# Patient Record
Sex: Female | Born: 1935
Health system: Southern US, Community
[De-identification: ages and names within clinical notes are randomized; demographics above are authoritative.]

---

## 2015-09-01 ENCOUNTER — Observation Stay (HOSPITAL_COMMUNITY)
Admission: EM | Admit: 2015-09-01 | Discharge: 2015-09-03 | Disposition: A | Discharge status: Skilled Nursing Facility | Payer: Medicare Other | Attending: Internal Medicine | Admitting: Internal Medicine

## 2015-09-01 ENCOUNTER — Encounter (HOSPITAL_COMMUNITY): Payer: Self-pay | Admitting: *Deleted

## 2015-09-01 ENCOUNTER — Emergency Department (HOSPITAL_COMMUNITY): Payer: Medicare Other

## 2015-09-01 DIAGNOSIS — I129 Hypertensive chronic kidney disease with stage 1 through stage 4 chronic kidney disease, or unspecified chronic kidney disease: Secondary | ICD-10-CM | POA: Diagnosis not present

## 2015-09-01 DIAGNOSIS — I10 Essential (primary) hypertension: Secondary | ICD-10-CM | POA: Diagnosis present

## 2015-09-01 DIAGNOSIS — Z23 Encounter for immunization: Secondary | ICD-10-CM | POA: Insufficient documentation

## 2015-09-01 DIAGNOSIS — G309 Alzheimer's disease, unspecified: Secondary | ICD-10-CM | POA: Diagnosis not present

## 2015-09-01 DIAGNOSIS — J811 Chronic pulmonary edema: Secondary | ICD-10-CM

## 2015-09-01 DIAGNOSIS — N183 Chronic kidney disease, stage 3 (moderate): Secondary | ICD-10-CM | POA: Diagnosis not present

## 2015-09-01 DIAGNOSIS — Z79899 Other long term (current) drug therapy: Secondary | ICD-10-CM | POA: Insufficient documentation

## 2015-09-01 DIAGNOSIS — Z6838 Body mass index (BMI) 38.0-38.9, adult: Secondary | ICD-10-CM | POA: Insufficient documentation

## 2015-09-01 DIAGNOSIS — H409 Unspecified glaucoma: Secondary | ICD-10-CM | POA: Insufficient documentation

## 2015-09-01 DIAGNOSIS — F0390 Unspecified dementia without behavioral disturbance: Secondary | ICD-10-CM | POA: Diagnosis present

## 2015-09-01 DIAGNOSIS — F0280 Dementia in other diseases classified elsewhere without behavioral disturbance: Secondary | ICD-10-CM | POA: Diagnosis not present

## 2015-09-01 DIAGNOSIS — G9340 Encephalopathy, unspecified: Secondary | ICD-10-CM | POA: Diagnosis not present

## 2015-09-01 DIAGNOSIS — R4182 Altered mental status, unspecified: Secondary | ICD-10-CM

## 2015-09-01 DIAGNOSIS — K219 Gastro-esophageal reflux disease without esophagitis: Secondary | ICD-10-CM | POA: Diagnosis not present

## 2015-09-01 DIAGNOSIS — Z86711 Personal history of pulmonary embolism: Secondary | ICD-10-CM | POA: Diagnosis not present

## 2015-09-01 DIAGNOSIS — E669 Obesity, unspecified: Secondary | ICD-10-CM | POA: Diagnosis not present

## 2015-09-01 DIAGNOSIS — R400 Somnolence: Secondary | ICD-10-CM | POA: Diagnosis present

## 2015-09-01 LAB — CBG MONITORING, ED: Glucose-Capillary: 99 mg/dL (ref 65–99)

## 2015-09-01 MED ORDER — SODIUM CHLORIDE 0.9 % IV BOLUS (SEPSIS)
500.0000 mL | Freq: Once | INTRAVENOUS | Status: AC
  Administered 2015-09-02: 500 mL via INTRAVENOUS

## 2015-09-01 NOTE — ED Notes (Signed)
CBG: 99 RN notified 

## 2015-09-01 NOTE — ED Provider Notes (Addendum)
By signing my name below, I, Ronney LionSuzanne Le, attest that this documentation has been prepared under the direction and in the presence of Kristen N Ward, DO. Electronically Signed: Ronney LionSuzanne Le, ED Scribe. 09/01/2015. 2:35 PM.  TIME SEEN: 11:14 PM   CHIEF COMPLAINT: Fatigue  LEVEL 5 CAVEAT DUE TO PATIENT NONVERBAL  HPI:  HPI Comments: Lori Hansen is a 82 y.o. female with a history of PE ~6 years ago (not currently on anticoagulation; previously anticoagulated on Warfarin), dementia, hypertension, and hyperlipidemia brought in by ambulance [facility], who presents to the Emergency Department, for constant, moderate, somnolence that began today. Her family states EMS [facility] for somnolence, although her family is unaware of any symptoms recently. Patient's daughter states she last saw patient well about 10 hours ago, at 2 PM, after she had taken patient out shopping. She is unaware of any fever but states patient had refused to take off her jacket all day today, despite being in 80-degree weather. Her daughter also denies falls or head injuries. Per medical records, patient is currently prescribed Vistaril as needed for itching. No other sedatives listed. Her family denies any home oxygen therapy. She does note patient was on Warfarin for a long period of time, although she is not currently on any anticoagulation. Her daughter denies any recent known cough, vomiting, or diarrhea.  PCP on MAR - Dr. Ron ParkerSamuel Bowen  ROS: LEVEL 5 CAVEAT DUE TO: PATIENT NONVERBAL  PAST MEDICAL HISTORY/PAST SURGICAL HISTORY:  History reviewed. No pertinent past medical history.  MEDICATIONS:  Prior to Admission medications   Medication Sig Start Date End Date Taking? Authorizing Provider  acetaminophen (TYLENOL) 500 MG tablet Take 500 mg by mouth every 4 (four) hours as needed for moderate pain.   Yes Historical Provider, MD  alum & mag hydroxide-simeth  (GELUSIL) 200-200-25 MG chewable tablet Chew 1 tablet by mouth every 6 (six) hours as needed for indigestion or heartburn.   Yes Historical Provider, MD  donepezil (ARICEPT) 10 MG tablet Take 10 mg by mouth at bedtime.   Yes Historical Provider, MD  ergocalciferol (VITAMIN D2) 50000 units capsule Take 50,000 Units by mouth once a week.   Yes Historical Provider, MD  esomeprazole (NEXIUM) 40 MG capsule Take 40 mg by mouth daily at 12 noon.   Yes Historical Provider, MD  guaifenesin (ROBAFEN) 100 MG/5ML syrup Take 200 mg by mouth 3 (three) times daily as needed for cough.   Yes Historical Provider, MD  hydrochlorothiazide (MICROZIDE) 12.5 MG capsule Take 12.5 mg by mouth daily.   Yes Historical Provider, MD  hydrOXYzine (ATARAX/VISTARIL) 25 MG tablet Take 25 mg by mouth 3 (three) times daily as needed.   Yes Historical Provider, MD  loperamide (IMODIUM) 2 MG capsule Take 2 mg by mouth as needed for diarrhea or loose stools.   Yes Historical Provider, MD  loratadine (CLARITIN) 10 MG tablet Take 10 mg by mouth daily.   Yes Historical Provider, MD  magnesium hydroxide (MILK OF MAGNESIA) 400 MG/5ML suspension Take 30 mLs by mouth daily as needed for mild constipation.   Yes Historical Provider, MD  metoprolol succinate (TOPROL-XL) 25 MG 24 hr tablet Take 25 mg by mouth daily.   Yes Historical Provider, MD  neomycin-bacitracin-polymyxin (NEOSPORIN) OINT Apply 1 application topically daily as needed for wound care.   Yes Historical Provider, MD  nystatin (NYSTATIN) powder Apply 1 g topically daily.   Yes Historical Provider, MD  Skin Protectants,  Misc. (EUCERIN) cream Apply 1 application topically as needed for dry skin.   Yes Historical Provider, MD  Travoprost, BAK Free, (TRAVATAN) 0.004 % SOLN ophthalmic solution Place 1 drop into both eyes at bedtime.   Yes Historical Provider, MD  triamcinolone cream (KENALOG) 0.1 % Apply 1 application topically 2 (two) times daily as needed (irritation).   Yes  Historical Provider, MD    ALLERGIES:  No Known Allergies  SOCIAL HISTORY:  Social History  Substance Use Topics  . Smoking status: Unknown If Ever Smoked  . Smokeless tobacco: Not on file  . Alcohol Use: No    FAMILY HISTORY: History reviewed. No pertinent family history.  EXAM: BP 113/65 mmHg  Pulse 68  Temp(Src) 97.5 F (36.4 C) (Rectal)  Resp 18  SpO2 94% CONSTITUTIONAL: She is drowsy, but will arouse spontaneously and intermittently to voice and painful stimuli. Able to mumble some words, MAE equally, and open eyes. GCS 13. Elderly, obese, and in NAD. Afebrile. HEAD: Normocephalic, atraumatic EYES: Conjunctivae clear, pupils 3-56mm bilaterally and sluggishly reactive ENT: normal nose; no rhinorrhea; moist mucous membranes NECK: Supple, no meningismus, no LAD, no midline step-off or deformity CARD: RRR; S1 and S2 appreciated; no murmurs, no clicks, no rubs, no gallops RESP: Normal chest excursion without splinting or tachypnea; breath sounds clear and equal bilaterally; no wheezes, no rhonchi, no rales, no hypoxia or respiratory distress, speaking full sentences ABD/GI: Normal bowel sounds; non-distended; soft, non-tender, no rebound, no guarding, no peritoneal signs BACK:  The back appears normal and is non-tender to palpation, there is no CVA tenderness; no midline step-off or deformity EXT: Normal ROM in all joints; non-tender to palpation; no edema; normal capillary refill; no cyanosis, no calf tenderness or swelling    SKIN: Normal color for age and race; warm; no rash NEURO: Moves all extremities equally, GCS 13.  PSYCH: Unable to assess.   MEDICAL DECISION MAKING: Patient here with altered status. She will arouse at times spontaneously and voice but mostly to painful stimuli. She is protecting her airway. We'll put her eyes, move all her extremities. She can mumble some words to rest. She was able to tell nursing staff that she is "just tired". Call nursing facility  (spoke to [place] at [facility] [phone]) who states 8 PM she received her Aricept and eyedrops. The only medication that she has listed that as a sedative is Vistaril which she has not received it today or in the past month. No known head injury. No reports of any recent infectious symptoms. Last seen normal 2 PM. Differential diagnosis includes stroke, intracranial hemorrhage, infection, dehydration. Will obtain labs, urine, chest x-ray, head CT. Blood glucose is normal. EKG shows no ischemic abnormalities.   ED PROGRESS:   2:31 AM - Patient's workup has been unremarkable other than mild leukocytosis with predominant lymphocytes. She does have mildly elevated creatinine without old for comparison but a normal BUNs I suspect that this is her baseline. Urine shows moderate hemoglobin but no other sign of infection. Drug screen is negative. Head CT shows no acute abnormalities. Chest x-ray shows no infiltrate. Patient is now more arousable and arouses to voice more easily than she did previously but still very drowsy and falls back asleep quickly. She is able to tell me her first name and that she thinks that she is at [facility]. She thinks the year is 12.  Per family, this is very different than her normal. They state that she is normally very interactive, talkative,  ambulatory. We'll discuss with hospitalist for observation admission. She may need an MRI of her brain to rule out acute stroke although no obvious focal neurologic deficit on exam. She would not be a TPA candidate given last seen normal 2 PM yesterday.  3:05 AM  Discussed patient's case with hospitalist, Dr. Lovell Sheehan.  Recommend admission to telemetry, observation bed.  I will place holding orders per their request. Patient and family (if present) updated with plan. Care transferred to hospitalist service.  Discussed with Dr. Lovell Sheehan who agrees that we can hold off on MRI of her brain at this time given no focal neurologic  deficits and she would be outside of TPA window given last seen normal that can be documented was 2 PM. Nursing staff unable to tell me when she was last normal. Recommend obtaining ABG to insure no sign of hypercarbia.    I reviewed all nursing notes, vitals, pertinent old records, EKGs, labs, imaging (as available).    EKG Interpretation  Date/Time:  Saturday Sep 01 2015 23:59:21 EDT Ventricular Rate:  67 PR Interval:  159 QRS Duration: 93 QT Interval:  468 QTC Calculation: 494 R Axis:   7 Text Interpretation:  Sinus rhythm Abnormal R-wave progression, early transition Left ventricular hypertrophy Borderline prolonged QT interval No old tracing to compare Confirmed by WARD,  DO, KRISTEN ([id number]) on 09/02/2015 12:06:30 AM        I personally performed the services described in this documentation, which was scribed in my presence. The recorded information has been reviewed and is accurate.   Kristen N Ward, DO 09/02/15 0307   4:00 AM  Patient's ABG shows no respiratory acidosis, hypercarbia. Patient is still stable. No significant change.  Layla Maw Ward, DO 09/02/15 [phone]

## 2015-09-01 NOTE — ED Notes (Signed)
[facility] arrives [facility]. Pt was out all day with her daughter shopping, was up 1.5 hours past her bedtime and had her night time meds. Pt states she just wants to be left alone and shes tired.

## 2015-09-02 ENCOUNTER — Emergency Department (HOSPITAL_COMMUNITY): Payer: Medicare Other

## 2015-09-02 ENCOUNTER — Encounter (HOSPITAL_COMMUNITY): Payer: Self-pay | Admitting: Radiology

## 2015-09-02 ENCOUNTER — Observation Stay (HOSPITAL_COMMUNITY): Payer: Medicare Other

## 2015-09-02 DIAGNOSIS — I10 Essential (primary) hypertension: Secondary | ICD-10-CM | POA: Diagnosis not present

## 2015-09-02 DIAGNOSIS — R4182 Altered mental status, unspecified: Secondary | ICD-10-CM | POA: Insufficient documentation

## 2015-09-02 DIAGNOSIS — G9340 Encephalopathy, unspecified: Secondary | ICD-10-CM | POA: Diagnosis present

## 2015-09-02 DIAGNOSIS — F0390 Unspecified dementia without behavioral disturbance: Secondary | ICD-10-CM | POA: Diagnosis present

## 2015-09-02 LAB — I-STAT ARTERIAL BLOOD GAS, ED
Acid-base deficit: 1 mmol/L (ref 0.0–2.0)
BICARBONATE: 24.9 meq/L — AB (ref 20.0–24.0)
O2 Saturation: 92 %
PCO2 ART: 44.1 mmHg (ref 35.0–45.0)
PH ART: 7.361 (ref 7.350–7.450)
PO2 ART: 68 mmHg — AB (ref 80.0–100.0)
Patient temperature: 98.6
TCO2: 26 mmol/L (ref 0–100)

## 2015-09-02 LAB — URINALYSIS, ROUTINE W REFLEX MICROSCOPIC
Bilirubin Urine: NEGATIVE
GLUCOSE, UA: NEGATIVE mg/dL
KETONES UR: NEGATIVE mg/dL
LEUKOCYTES UA: NEGATIVE
Nitrite: NEGATIVE
PROTEIN: NEGATIVE mg/dL
Specific Gravity, Urine: 1.02 (ref 1.005–1.030)
pH: 5.5 (ref 5.0–8.0)

## 2015-09-02 LAB — CBC
HEMATOCRIT: 40.3 % (ref 36.0–46.0)
HEMOGLOBIN: 13 g/dL (ref 12.0–15.0)
MCH: 29 pg (ref 26.0–34.0)
MCHC: 32.3 g/dL (ref 30.0–36.0)
MCV: 89.8 fL (ref 78.0–100.0)
Platelets: 239 10*3/uL (ref 150–400)
RBC: 4.49 MIL/uL (ref 3.87–5.11)
RDW: 15.5 % (ref 11.5–15.5)
WBC: 13 10*3/uL — ABNORMAL HIGH (ref 4.0–10.5)

## 2015-09-02 LAB — CBC WITH DIFFERENTIAL/PLATELET
BASOS PCT: 1 %
Basophils Absolute: 0.1 10*3/uL (ref 0.0–0.1)
EOS ABS: 0.2 10*3/uL (ref 0.0–0.7)
EOS PCT: 2 %
HCT: 38.7 % (ref 36.0–46.0)
Hemoglobin: 12.4 g/dL (ref 12.0–15.0)
LYMPHS ABS: 6.2 10*3/uL — AB (ref 0.7–4.0)
Lymphocytes Relative: 56 %
MCH: 29 pg (ref 26.0–34.0)
MCHC: 32 g/dL (ref 30.0–36.0)
MCV: 90.4 fL (ref 78.0–100.0)
MONO ABS: 1 10*3/uL (ref 0.1–1.0)
Monocytes Relative: 9 %
NEUTROS ABS: 3.5 10*3/uL (ref 1.7–7.7)
NEUTROS PCT: 32 %
PLATELETS: 241 10*3/uL (ref 150–400)
RBC: 4.28 MIL/uL (ref 3.87–5.11)
RDW: 15.3 % (ref 11.5–15.5)
WBC: 11 10*3/uL — ABNORMAL HIGH (ref 4.0–10.5)

## 2015-09-02 LAB — COMPREHENSIVE METABOLIC PANEL
ALT: 23 U/L (ref 14–54)
ANION GAP: 11 (ref 5–15)
AST: 40 U/L (ref 15–41)
Albumin: 3.2 g/dL — ABNORMAL LOW (ref 3.5–5.0)
Alkaline Phosphatase: 85 U/L (ref 38–126)
BUN: 13 mg/dL (ref 6–20)
CHLORIDE: 106 mmol/L (ref 101–111)
CO2: 25 mmol/L (ref 22–32)
CREATININE: 1.36 mg/dL — AB (ref 0.44–1.00)
Calcium: 9 mg/dL (ref 8.9–10.3)
GFR, EST AFRICAN AMERICAN: 40 mL/min — AB (ref >60)
GFR, EST NON AFRICAN AMERICAN: 35 mL/min — AB (ref >60)
Glucose, Bld: 101 mg/dL — ABNORMAL HIGH (ref 65–99)
Potassium: 3.5 mmol/L (ref 3.5–5.1)
SODIUM: 142 mmol/L (ref 135–145)
Total Bilirubin: 0.9 mg/dL (ref 0.3–1.2)
Total Protein: 6.3 g/dL — ABNORMAL LOW (ref 6.5–8.1)

## 2015-09-02 LAB — BASIC METABOLIC PANEL
ANION GAP: 15 (ref 5–15)
BUN: 12 mg/dL (ref 6–20)
CHLORIDE: 106 mmol/L (ref 101–111)
CO2: 21 mmol/L — AB (ref 22–32)
Calcium: 9.6 mg/dL (ref 8.9–10.3)
Creatinine, Ser: 1.21 mg/dL — ABNORMAL HIGH (ref 0.44–1.00)
GFR calc non Af Amer: 40 mL/min — ABNORMAL LOW (ref >60)
GFR, EST AFRICAN AMERICAN: 47 mL/min — AB (ref >60)
Glucose, Bld: 77 mg/dL (ref 65–99)
POTASSIUM: 3.9 mmol/L (ref 3.5–5.1)
Sodium: 142 mmol/L (ref 135–145)

## 2015-09-02 LAB — GLUCOSE, CAPILLARY
GLUCOSE-CAPILLARY: 82 mg/dL (ref 65–99)
GLUCOSE-CAPILLARY: 144 mg/dL — AB (ref 65–99)
GLUCOSE-CAPILLARY: 74 mg/dL (ref 65–99)
Glucose-Capillary: 90 mg/dL (ref 65–99)
Glucose-Capillary: 68 mg/dL (ref 65–99)

## 2015-09-02 LAB — URINE MICROSCOPIC-ADD ON

## 2015-09-02 LAB — I-STAT TROPONIN, ED: TROPONIN I, POC: 0 ng/mL (ref 0.00–0.08)

## 2015-09-02 LAB — RAPID URINE DRUG SCREEN, HOSP PERFORMED
Amphetamines: NOT DETECTED
BENZODIAZEPINES: NOT DETECTED
Barbiturates: NOT DETECTED
Cocaine: NOT DETECTED
OPIATES: NOT DETECTED
Tetrahydrocannabinol: NOT DETECTED

## 2015-09-02 LAB — MRSA PCR SCREENING: MRSA BY PCR: NEGATIVE

## 2015-09-02 LAB — AMMONIA: AMMONIA: 30 umol/L (ref 9–35)

## 2015-09-02 MED ORDER — DEXTROSE 50 % IV SOLN
INTRAVENOUS | Status: AC
  Filled 2015-09-02: qty 50

## 2015-09-02 MED ORDER — HYDROCHLOROTHIAZIDE 12.5 MG PO CAPS: 12.5000 mg | ORAL_CAPSULE | Freq: Every day | ORAL | Status: DC

## 2015-09-02 MED ORDER — METOPROLOL SUCCINATE ER 25 MG PO TB24
25.0000 mg | ORAL_TABLET | Freq: Every day | ORAL | Status: DC
  Administered 2015-09-02 – 2015-09-03 (×2): 25 mg via ORAL
  Filled 2015-09-02 (×2): qty 1

## 2015-09-02 MED ORDER — PNEUMOCOCCAL VAC POLYVALENT 25 MCG/0.5ML IJ INJ
0.5000 mL | INJECTION | INTRAMUSCULAR | Status: AC
  Administered 2015-09-03: 0.5 mL via INTRAMUSCULAR
  Filled 2015-09-02: qty 0.5

## 2015-09-02 MED ORDER — LOPERAMIDE HCL 2 MG PO CAPS: 2.0000 mg | ORAL_CAPSULE | ORAL | Status: DC | PRN

## 2015-09-02 MED ORDER — ONDANSETRON HCL 4 MG PO TABS: 4.0000 mg | ORAL_TABLET | Freq: Four times a day (QID) | ORAL | Status: DC | PRN

## 2015-09-02 MED ORDER — LATANOPROST 0.005 % OP SOLN
1.0000 [drp] | Freq: Every day | OPHTHALMIC | Status: DC
  Administered 2015-09-02: 1 [drp] via OPHTHALMIC
  Filled 2015-09-02: qty 2.5

## 2015-09-02 MED ORDER — HYDROMORPHONE HCL 1 MG/ML IJ SOLN: 0.5000 mg | INTRAMUSCULAR | Status: DC | PRN

## 2015-09-02 MED ORDER — DONEPEZIL HCL 10 MG PO TABS
10.0000 mg | ORAL_TABLET | Freq: Every day | ORAL | Status: DC
Start: 2015-09-02 — End: 2015-09-03
  Administered 2015-09-02: 10 mg via ORAL
  Filled 2015-09-02: qty 1

## 2015-09-02 MED ORDER — ONDANSETRON HCL 4 MG/2ML IJ SOLN: 4.0000 mg | Freq: Four times a day (QID) | INTRAMUSCULAR | Status: DC | PRN

## 2015-09-02 MED ORDER — PANTOPRAZOLE SODIUM 40 MG PO TBEC
40.0000 mg | DELAYED_RELEASE_TABLET | Freq: Every day | ORAL | Status: DC
  Administered 2015-09-02 – 2015-09-03 (×2): 40 mg via ORAL
  Filled 2015-09-02 (×2): qty 1

## 2015-09-02 MED ORDER — SODIUM CHLORIDE 0.9 % IV SOLN
INTRAVENOUS | Status: DC
  Administered 2015-09-02: 05:00:00 via INTRAVENOUS

## 2015-09-02 MED ORDER — ACETAMINOPHEN 325 MG PO TABS: 650.0000 mg | ORAL_TABLET | Freq: Four times a day (QID) | ORAL | Status: DC | PRN

## 2015-09-02 MED ORDER — ACETAMINOPHEN 650 MG RE SUPP: 650.0000 mg | Freq: Four times a day (QID) | RECTAL | Status: DC | PRN

## 2015-09-02 MED ORDER — ENOXAPARIN SODIUM 40 MG/0.4ML ~~LOC~~ SOLN
40.0000 mg | SUBCUTANEOUS | Status: DC
  Administered 2015-09-02: 40 mg via SUBCUTANEOUS
  Filled 2015-09-02: qty 0.4

## 2015-09-02 MED ORDER — SODIUM CHLORIDE 0.9% FLUSH
3.0000 mL | Freq: Two times a day (BID) | INTRAVENOUS | Status: DC
  Administered 2015-09-02 – 2015-09-03 (×2): 3 mL via INTRAVENOUS

## 2015-09-02 NOTE — Progress Notes (Signed)
PROGRESS NOTE    Lori OvermanMuriel Grizzell  UJW:[id number]:[id number] DOB: 11/22/1929 DOA: 09/01/2015 PCP: No primary care provider on file.   Brief Narrative:  On 09/02/2015 by Dr. Della GooHarvette Jenkins Lori OvermanMuriel Coletta is a 82 y.o. female resident [facility] who was sent to the ED due to increased somnolence and lethargy over the day. She had been last seen normal by her family at 2 PM. She is arousible and denies any pain. She was evaluated in the ED and a Ct scan of the head was performed and was negative for acute findings. Her labs were not remarkable, and a UDS was performed and was negative, and ABG has bee ordered. She was referred for Observation.  Assessment & Plan   Acute encephalopathy -Appears to have resolved -Unclear etiology, question medications -CT head: No acute intracranial abnormalities -Patient appears be back at baseline, no neurological deficits -Chest x-ray shows pulmonary vascular congestion in this low inspiratory portable examination -Will obtain repeat chest x-ray although on exam lungs are clear -UA unremarkable for infection -Toxicology screen negative  Essential hypertension -Will restart home medications, metoprolol  Dementia  -Will restart Aricept  Acute kidney injury vs Chronic kidney disease, stage III -HCTZ held -Was placed on IV fluids -Continue to monitor BMP  DVT Prophylaxis  Lovenox  Code Status: Full  Family Communication: Son at bedside  Disposition Plan: Admitted for observation  Consultants None  Procedures  None  Antibiotics   Anti-infectives    None      Subjective:   Lori OvermanMuriel Rundell seen and examined today.  Patient has no complaints. Denies  Pain, chest pain, shortness of breath, abdominal pain, nausea or vomiting.  Objective:   Filed Vitals:   09/02/15 0345 09/02/15 0427 09/02/15 0427 09/02/15 1253  BP: 136/80  138/64 156/72  Pulse: 55  58 62  Temp:   97.6 F (36.4 C) 97.7 F (36.5 C)  TempSrc:   Oral  Oral  Resp: 13  20 15   Height:  5\' 6"  (1.676 m)    Weight:  109.1 kg (240 lb 8.4 oz)    SpO2: 94%  100% 98%    Intake/Output Summary (Last 24 hours) at 09/02/15 1411 Last data filed at 09/02/15 1350  Gross per 24 hour  Intake   12.5 ml  Output    700 ml  Net -687.5 ml   Filed Weights   09/02/15 0427  Weight: 109.1 kg (240 lb 8.4 oz)    Exam  General: Well developed, well nourished, NAD, appears stated age  HEENT: NCAT,  mucous membranes moist.   Cardiovascular: S1 S2 auscultated, no rubs, murmurs or gallops. Regular rate and rhythm.  Respiratory: Clear to auscultation bilaterally  Abdomen: Soft, nontender, nondistended, + bowel sounds  Extremities: warm dry without cyanosis clubbing or edema  Neuro: AAOx2, has dementia, nonfocal  Skin: Without rashes exudates or nodules  Psych: Normal affect and demeanor   Data Reviewed: I have personally reviewed following labs and imaging studies  CBC:  Recent Labs Lab 09/02/15 0025 09/02/15 0618  WBC 11.0* 13.0*  NEUTROABS 3.5  --   HGB 12.4 13.0  HCT 38.7 40.3  MCV 90.4 89.8  PLT 241 239   Basic Metabolic Panel:  Recent Labs Lab 09/02/15 0025 09/02/15 0618  NA 142 142  K 3.5 3.9  CL 106 106  CO2 25 21*  GLUCOSE 101* 77  BUN 13 12  CREATININE 1.36* 1.21*  CALCIUM 9.0 9.6   GFR: Estimated Creatinine Clearance: 44  mL/min (by C-G formula based on Cr of 1.21). Liver Function Tests:  Recent Labs Lab 09/02/15 0025  AST 40  ALT 23  ALKPHOS 85  BILITOT 0.9  PROT 6.3*  ALBUMIN 3.2*   No results for input(s): LIPASE, AMYLASE in the last 168 hours.  Recent Labs Lab 09/02/15 0023  AMMONIA 30   Coagulation Profile: No results for input(s): INR, PROTIME in the last 168 hours. Cardiac Enzymes: No results for input(s): CKTOTAL, CKMB, CKMBINDEX, TROPONINI in the last 168 hours. BNP (last 3 results) No results for input(s): PROBNP in the last 8760 hours. HbA1C: No results for input(s): HGBA1C in the  last 72 hours. CBG:  Recent Labs Lab 09/01/15 2332 09/02/15 0449 09/02/15 0741 09/02/15 0824 09/02/15 1255  GLUCAP 99 90 68 82 144*   Lipid Profile: No results for input(s): CHOL, HDL, LDLCALC, TRIG, CHOLHDL, LDLDIRECT in the last 72 hours. Thyroid Function Tests: No results for input(s): TSH, T4TOTAL, FREET4, T3FREE, THYROIDAB in the last 72 hours. Anemia Panel: No results for input(s): VITAMINB12, FOLATE, FERRITIN, TIBC, IRON, RETICCTPCT in the last 72 hours. Urine analysis:    Component Value Date/Time   COLORURINE YELLOW 09/02/2015 0040   APPEARANCEUR CLEAR 09/02/2015 0040   LABSPEC 1.020 09/02/2015 0040   PHURINE 5.5 09/02/2015 0040   GLUCOSEU NEGATIVE 09/02/2015 0040   HGBUR MODERATE* 09/02/2015 0040   BILIRUBINUR NEGATIVE 09/02/2015 0040   KETONESUR NEGATIVE 09/02/2015 0040   PROTEINUR NEGATIVE 09/02/2015 0040   NITRITE NEGATIVE 09/02/2015 0040   LEUKOCYTESUR NEGATIVE 09/02/2015 0040   Sepsis Labs: @LABRCNTIP (procalcitonin:4,lacticidven:4)  ) Recent Results (from the past 240 hour(s))  MRSA PCR Screening     Status: None   Collection Time: 09/02/15  5:02 AM  Result Value Ref Range Status   MRSA by PCR NEGATIVE NEGATIVE Final    Comment:        The GeneXpert MRSA Assay (FDA approved for NASAL specimens only), is one component of a comprehensive MRSA colonization surveillance program. It is not intended to diagnose MRSA infection nor to guide or monitor treatment for MRSA infections.       Radiology Studies: Ct Head Wo Contrast  09/02/2015  CLINICAL DATA:  Altered mental status. EXAM: CT HEAD WITHOUT CONTRAST TECHNIQUE: Contiguous axial images were obtained from the base of the skull through the vertex without intravenous contrast. COMPARISON:  None. FINDINGS: Diffuse cerebral atrophy. Mild ventricular dilatation consistent with central atrophy. Low-attenuation changes in the deep white matter consistent with small vessel ischemia. No mass effect or  midline shift. No abnormal extra-axial fluid collections. Gray-white matter junctions are distinct. Basal cisterns are not effaced. No evidence of acute intracranial hemorrhage. No depressed skull fractures. Mild mucosal thickening in the paranasal sinuses. Mastoid air cells are not opacified. Vascular calcifications. IMPRESSION: No acute intracranial abnormalities. Chronic atrophy and small vessel ischemic changes. Electronically Signed   By: Burman Nieves M.D.   On: 09/02/2015 02:03   Dg Chest Portable 1 View  09/02/2015  CLINICAL DATA:  Altered mental status. EXAM: PORTABLE CHEST 1 VIEW COMPARISON:  None. FINDINGS: Cardiomediastinal silhouette is unremarkable for this low inspiratory portable examination with crowded engorged vasculature markings. The lungs are clear without pleural effusions or focal consolidations. Elevated RIGHT hemidiaphragm. Trachea projects midline and there is no pneumothorax. Included soft tissue planes and osseous structures are non-suspicious. IMPRESSION: Pulmonary vascular congestion in this low inspiratory portable examination. Electronically Signed   By: Awilda Metro M.D.   On: 09/02/2015 00:42     Scheduled Meds: .  enoxaparin (LOVENOX) injection  40 mg Subcutaneous Q24H  . latanoprost  1 drop Both Eyes QHS  . [START ON 09/03/2015] pneumococcal 23 valent vaccine  0.5 mL Intramuscular Tomorrow-1000  . sodium chloride flush  3 mL Intravenous Q12H   Continuous Infusions:      Time Spent in minutes   30 minutes  Lucia Harm D.O. on 09/02/2015 at 2:11 PM  Between 7am to 7pm - Pager - [phone]  After 7pm go to [email or web address] - password [email or web address]  And look for the night coverage person covering for me after hours  [facility] Office  [phone]

## 2015-09-02 NOTE — Progress Notes (Signed)
Hypoglycemic Event  CBG: 68  Treatment: 15 gm carbohydrate snack  Symptoms: None  Follow-up CBG: Time:82 CBG Result:0826  Possible Reasons for Event: Inadequate meal intake  Comments/MD notified:Dr. Catha GosselinMikhail, started on clear liquid diet    Laural BenesJohnson, Raylynne Cubbage C

## 2015-09-02 NOTE — H&P (Signed)
[facility] Admission History and Physical       Lori Hansen ZOX:[id number] DOB: 07-Oct-1929 DOA: 09/01/2015  Referring physician: EDP PCP:  Dr. Ron Parker in [place] Specialists:   Chief Complaint: Increased Lethargy  HPI: Lori Hansen is a 82 y.o. female resident of the [facility] SNF who was sent to the ED due to increased somnolence and lethargy over the day.   She had been last seen normal by her family at 2 PM.   She is arousible and denies any pain.  She was evaluated in the ED and a Ct scan of the head was performed and was negative for acute findings.   Her labs were not remarkable, and a UDS was performed and was negative, and ABG has bee ordered.    She was referred for Observation.      Review of Systems: Unable to Obtain from the Patient   Past Medical History:    HTN  Asthma  Previous Pulmonary Embolism  GERD  Hyperlipidemia  Glaucoma  Alzheimer's Dementia   History reviewed. No pertinent past surgical history.    Prior to Admission medications   Medication Sig Start Date End Date Taking? Authorizing Provider  acetaminophen (TYLENOL) 500 MG tablet Take 500 mg by mouth every 4 (four) hours as needed for moderate pain.   Yes Historical Provider, MD  alum & mag hydroxide-simeth (GELUSIL) 200-200-25 MG chewable tablet Chew 1 tablet by mouth every 6 (six) hours as needed for indigestion or heartburn.   Yes Historical Provider, MD  donepezil (ARICEPT) 10 MG tablet Take 10 mg by mouth at bedtime.   Yes Historical Provider, MD  ergocalciferol (VITAMIN D2) 50000 units capsule Take 50,000 Units by mouth once a week.   Yes Historical Provider, MD  esomeprazole (NEXIUM) 40 MG capsule Take 40 mg by mouth daily at 12 noon.   Yes Historical Provider, MD  guaifenesin (ROBAFEN) 100 MG/5ML syrup Take 200 mg by mouth 3 (three) times daily as needed for cough.   Yes Historical Provider, MD  hydrochlorothiazide (MICROZIDE) 12.5 MG capsule Take 12.5 mg by  mouth daily.   Yes Historical Provider, MD  hydrOXYzine (ATARAX/VISTARIL) 25 MG tablet Take 25 mg by mouth 3 (three) times daily as needed.   Yes Historical Provider, MD  loperamide (IMODIUM) 2 MG capsule Take 2 mg by mouth as needed for diarrhea or loose stools.   Yes Historical Provider, MD  loratadine (CLARITIN) 10 MG tablet Take 10 mg by mouth daily.   Yes Historical Provider, MD  magnesium hydroxide (MILK OF MAGNESIA) 400 MG/5ML suspension Take 30 mLs by mouth daily as needed for mild constipation.   Yes Historical Provider, MD  metoprolol succinate (TOPROL-XL) 25 MG 24 hr tablet Take 25 mg by mouth daily.   Yes Historical Provider, MD  neomycin-bacitracin-polymyxin (NEOSPORIN) OINT Apply 1 application topically daily as needed for wound care.   Yes Historical Provider, MD  nystatin (NYSTATIN) powder Apply 1 g topically daily.   Yes Historical Provider, MD  Skin Protectants, Misc. (EUCERIN) cream Apply 1 application topically as needed for dry skin.   Yes Historical Provider, MD  Travoprost, BAK Free, (TRAVATAN) 0.004 % SOLN ophthalmic solution Place 1 drop into both eyes at bedtime.   Yes Historical Provider, MD  triamcinolone cream (KENALOG) 0.1 % Apply 1 application topically 2 (two) times daily as needed (irritation).   Yes Historical Provider, MD     No Known Allergies     Social History:  Lives at the  [facility] SNF  reports that she does not drink alcohol. Her tobacco and drug histories are not on file.     History reviewed. No pertinent family history.     Physical Exam:  GEN:  Somnolent Obese Elderly 82 y.o. African American female examined and in no acute distress;  Filed Vitals:   09/02/15 0030 09/02/15 0100 09/02/15 0230 09/02/15 0245  BP: 127/64 117/75 171/75 125/59  Pulse: 67 113 60 59  Temp:      TempSrc:      Resp: 20 17 15 15   SpO2: 93% 93% 93% 94%   Blood pressure 125/59, pulse 59, temperature 97.5 F (36.4 C), temperature source Rectal, resp. rate  15, SpO2 94 %. PSYCH: She is alert and oriented x 1; does not appear anxious does not appear depressed; affect is normal HEENT: Normocephalic and Atraumatic, Mucous membranes pink; PERRLA; EOM intact; Fundi:  Benign;  No scleral icterus, Nares: Patent, Oropharynx: Clear, Edentulous with Dentures,    Neck:  FROM, No Cervical Lymphadenopathy nor Thyromegaly or Carotid Bruit; No JVD; Breasts:: Not examined CHEST WALL: No tenderness CHEST: Normal respiration, clear to auscultation bilaterally HEART: Regular rate and rhythm; no murmurs rubs or gallops BACK: No kyphosis or scoliosis; No CVA tenderness ABDOMEN: Positive Bowel Sounds,  Obese, Soft Non-Tender, No Rebound or Guarding; No Masses, No Organomegaly. Rectal Exam: Not done EXTREMITIES: No Cyanosis, Clubbing, or Edema; No Ulcerations. Genitalia: not examined PULSES: 2+ and symmetric SKIN: Normal hydration no rash or ulceration CNS:  Alert and Oriented x 1, No Focal Deficits Vascular: pulses palpable throughout    Labs on Admission:  Basic Metabolic Panel:  Recent Labs Lab 09/02/15 0025  NA 142  K 3.5  CL 106  CO2 25  GLUCOSE 101*  BUN 13  CREATININE 1.36*  CALCIUM 9.0   Liver Function Tests:  Recent Labs Lab 09/02/15 0025  AST 40  ALT 23  ALKPHOS 85  BILITOT 0.9  PROT 6.3*  ALBUMIN 3.2*   No results for input(s): LIPASE, AMYLASE in the last 168 hours.  Recent Labs Lab 09/02/15 0023  AMMONIA 30   CBC:  Recent Labs Lab 09/02/15 0025  WBC 11.0*  NEUTROABS 3.5  HGB 12.4  HCT 38.7  MCV 90.4  PLT 241   Cardiac Enzymes: No results for input(s): CKTOTAL, CKMB, CKMBINDEX, TROPONINI in the last 168 hours.  BNP (last 3 results) No results for input(s): BNP in the last 8760 hours.  ProBNP (last 3 results) No results for input(s): PROBNP in the last 8760 hours.  CBG:  Recent Labs Lab 09/01/15 2332  GLUCAP 99    Radiological Exams on Admission: Ct Head Wo Contrast  09/02/2015  CLINICAL DATA:   Altered mental status. EXAM: CT HEAD WITHOUT CONTRAST TECHNIQUE: Contiguous axial images were obtained from the base of the skull through the vertex without intravenous contrast. COMPARISON:  None. FINDINGS: Diffuse cerebral atrophy. Mild ventricular dilatation consistent with central atrophy. Low-attenuation changes in the deep white matter consistent with small vessel ischemia. No mass effect or midline shift. No abnormal extra-axial fluid collections. Gray-white matter junctions are distinct. Basal cisterns are not effaced. No evidence of acute intracranial hemorrhage. No depressed skull fractures. Mild mucosal thickening in the paranasal sinuses. Mastoid air cells are not opacified. Vascular calcifications. IMPRESSION: No acute intracranial abnormalities. Chronic atrophy and small vessel ischemic changes. Electronically Signed   By: Burman Nieves M.D.   On: 09/02/2015 02:03   Dg Chest Portable 1 View  09/02/2015  CLINICAL  DATA:  Altered mental status. EXAM: PORTABLE CHEST 1 VIEW COMPARISON:  None. FINDINGS: Cardiomediastinal silhouette is unremarkable for this low inspiratory portable examination with crowded engorged vasculature markings. The lungs are clear without pleural effusions or focal consolidations. Elevated RIGHT hemidiaphragm. Trachea projects midline and there is no pneumothorax. Included soft tissue planes and osseous structures are non-suspicious. IMPRESSION: Pulmonary vascular congestion in this low inspiratory portable examination. Electronically Signed   By: Awilda Metroourtnay  Bloomer M.D.   On: 09/02/2015 00:42     EKG: Independently reviewed. Normal Sinus Rhythm Rate = 67         Assessment/Plan:      82 y.o. female with  Principal Problem:    Acute encephalopathy   Cardiac Monitoring   Neuro Checks   NPO while not alert     Active Problems:    Benign essential HTN   PRN IV Hydralazine   Resume PO when Alert      Dementia   On Aricept    Resume when Alert    DVT  Prophylaxis   Lovenox     Code Status:     FULL CODE     Family Communication:   No Family Present    Disposition Plan:    Observation Status        Time spent: 6270 Minutes      Ron ParkerJENKINS,Larra Crunkleton C [facility] Pager [phone]   If 7AM -7PM Please Contact the Day Rounding Team MD for [facility]  If 7PM-7AM, Please Contact Night-Floor Coverage  [email or web address] Password [email or web address] 09/02/2015, 3:19 AM     ADDENDUM:   Patient was seen and examined on 09/02/2015

## 2015-09-03 DIAGNOSIS — N183 Chronic kidney disease, stage 3 (moderate): Secondary | ICD-10-CM

## 2015-09-03 DIAGNOSIS — I10 Essential (primary) hypertension: Secondary | ICD-10-CM | POA: Diagnosis not present

## 2015-09-03 DIAGNOSIS — F0390 Unspecified dementia without behavioral disturbance: Secondary | ICD-10-CM | POA: Diagnosis not present

## 2015-09-03 DIAGNOSIS — G9340 Encephalopathy, unspecified: Secondary | ICD-10-CM | POA: Diagnosis not present

## 2015-09-03 LAB — BASIC METABOLIC PANEL
ANION GAP: 8 (ref 5–15)
BUN: 12 mg/dL (ref 6–20)
CALCIUM: 9.3 mg/dL (ref 8.9–10.3)
CO2: 24 mmol/L (ref 22–32)
Chloride: 110 mmol/L (ref 101–111)
Creatinine, Ser: 1.31 mg/dL — ABNORMAL HIGH (ref 0.44–1.00)
GFR calc Af Amer: 42 mL/min — ABNORMAL LOW (ref >60)
GFR, EST NON AFRICAN AMERICAN: 37 mL/min — AB (ref >60)
GLUCOSE: 91 mg/dL (ref 65–99)
Potassium: 3.7 mmol/L (ref 3.5–5.1)
Sodium: 142 mmol/L (ref 135–145)

## 2015-09-03 LAB — CBC
HCT: 39.5 % (ref 36.0–46.0)
HEMOGLOBIN: 12.6 g/dL (ref 12.0–15.0)
MCH: 28.3 pg (ref 26.0–34.0)
MCHC: 31.9 g/dL (ref 30.0–36.0)
MCV: 88.8 fL (ref 78.0–100.0)
PLATELETS: 242 10*3/uL (ref 150–400)
RBC: 4.45 MIL/uL (ref 3.87–5.11)
RDW: 15.5 % (ref 11.5–15.5)
WBC: 12.1 10*3/uL — ABNORMAL HIGH (ref 4.0–10.5)

## 2015-09-03 LAB — PATHOLOGIST SMEAR REVIEW

## 2015-09-03 LAB — GLUCOSE, CAPILLARY: GLUCOSE-CAPILLARY: 82 mg/dL (ref 65–99)

## 2015-09-03 NOTE — Progress Notes (Signed)
[facility]-no fl2 needed due to observation status  [facility] Anticipated discharge date: 5/22 Family notified: Verlon AuLeslie Transportation by dtr Verlon AuLeslie  CSW signing off.  Merlyn LotJenna Holoman, LCSWA Clinical Social Worker [phone]

## 2015-09-03 NOTE — Discharge Instructions (Signed)
Confusion Confusion is the inability to think with your usual speed or clarity. Confusion may come on quickly or slowly over time. How quickly the confusion comes on depends on the cause. Confusion can be due to any number of causes. CAUSES   Concussion, head injury, or head trauma.  Seizures.  Stroke.  Fever.  Brain tumor.  Age related decreased brain function (dementia).  Heightened emotional states like rage or terror.  Mental illness in which the person loses the ability to determine what is real and what is not (hallucinations).  Infections such as a urinary tract infection (UTI).  Toxic effects from alcohol, drugs, or prescription medicines.  Dehydration and an imbalance of salts in the body (electrolytes).  Lack of sleep.  Low blood sugar (diabetes).  Low levels of oxygen from conditions such as chronic lung disorders.  Drug interactions or other medicine side effects.  Nutritional deficiencies, especially niacin, thiamine, vitamin C, or vitamin B.  Sudden drop in body temperature (hypothermia).  Change in routine, such as when traveling or hospitalized. SIGNS AND SYMPTOMS  People often describe their thinking as cloudy or unclear when they are confused. Confusion can also include feeling disoriented. That means you are unaware of where or who you are. You may also not know what the date or time is. If confused, you may also have difficulty paying attention, remembering, and making decisions. Some people also act aggressively when they are confused.  DIAGNOSIS  The medical evaluation of confusion may include:  Blood and urine tests.  X-rays.  Brain and nervous system tests.  Analyzing your brain waves (electroencephalogram or EEG).  Magnetic resonance imaging (MRI) of your head.  Computed tomography (CT) scan of your head.  Mental status tests in which your health care provider may ask many questions. Some of these questions may seem silly or strange,  but they are a very important test to help diagnose and treat confusion. TREATMENT  An admission to the hospital may not be needed, but a person with confusion should not be left alone. Stay with a family member or friend until the confusion clears. Avoid alcohol, pain relievers, or sedative drugs until you have fully recovered. Do not drive until directed by your health care provider. HOME CARE INSTRUCTIONS  What family and friends can do:  To find out if someone is confused, ask the person to state his or her name, age, and the date. If the person is unsure or answers incorrectly, he or she is confused.  Always introduce yourself, no matter how well the person knows you.  Often remind the person of his or her location.  Place a calendar and clock near the confused person.  Help the person with his or her medicines. You may want to use a pill box, an alarm as a reminder, or give the person each dose as prescribed.  Talk about current events and plans for the day.  Try to keep the environment calm, quiet, and peaceful.  Make sure the person keeps follow-up visits with his or her health care provider. PREVENTION  Ways to prevent confusion:  Avoid alcohol.  Eat a balanced diet.  Get enough sleep.  Take medicine only as directed by your health care provider.  Do not become isolated. Spend time with other people and make plans for your days.  Keep careful watch on your blood sugar levels if you are diabetic. SEEK IMMEDIATE MEDICAL CARE IF:   You develop severe headaches, repeated vomiting, seizures, blackouts, or   slurred speech.  There is increasing confusion, weakness, numbness, restlessness, or personality changes.  You develop a loss of balance, have marked dizziness, feel uncoordinated, or fall.  You have delusions, hallucinations, or develop severe anxiety.  Your family members think you need to be rechecked.   This information is not intended to replace advice given  to you by your health care provider. Make sure you discuss any questions you have with your health care provider.   Document Released: 05/08/2004 Document Revised: 04/21/2014 Document Reviewed: 05/06/2013 Elsevier Interactive Patient Education 2016 [employer] Inc[employer]  

## 2015-09-03 NOTE — Care Management Note (Signed)
Case Management Note  Patient Details  Name: Flonnie OvermanMuriel Peer MRN: [id number] Date of Birth: 08/07/1929  Subjective/Objective:                 Patient in obs from [facility] for acute confusion. Resolved.   Action/Plan:  [facility] facilitated by CSW.   Expected Discharge Date:                  Expected Discharge Plan:   ([facility])  In-House Referral:  Clinical Social Work  Discharge planning Services  CM Consult  Post Acute Care Choice:  NA Choice offered to:     DME Arranged:    DME Agency:     HH Arranged:    HH Agency:     Status of Service:  Completed, signed off  Medicare Important Message Given:    Date Medicare IM Given:    Medicare IM give by:    Date Additional Medicare IM Given:    Additional Medicare Important Message give by:     If discussed at Long Length of Stay Meetings, dates discussed:    Additional Comments:  Lawerance SabalDebbie Hisashi Amadon, RN 09/03/2015, 11:14 AM

## 2015-09-03 NOTE — Care Management Obs Status (Signed)
MEDICARE OBSERVATION STATUS NOTIFICATION   Patient Details  Name: Lori Hansen MRN: [id number] Date of Birth: 07/01/1929   Medicare Observation Status Notification Given:  Yes    Lawerance Sabalebbie Mariadelaluz [place], RN 09/03/2015, 11:13 AM

## 2015-09-03 NOTE — Progress Notes (Addendum)
Patient discharged back to SNF, discharge packet prepared by social work and sent to SNF via daughter. Patient transported to SNF by family left floor via wheelchair accompanied by volunteers no c/o pain or shortness of breath at d/c.  [facility]   Seini Lannom, Kae HellerMiranda Lynn, RN

## 2015-09-03 NOTE — Discharge Summary (Addendum)
Physician Discharge Summary  Lori Hansen UJW:[id number] DOB: [birth year] DOA: 09/01/2015  PCP: No primary care provider on file.  Admit date: 09/01/2015 Discharge date: 09/03/2015  Time spent: 45 minutes  Recommendations for Outpatient Follow-up:  Patient will be discharged to assisted living facility.  Patient will need to follow up with primary care provider within one week of discharge, repeat BMP in one week.  Patient should continue medications as prescribed.  Patient should follow a regular diet.   Discharge Diagnoses:  Acute encephalopathy Essential hypertension Dementia Chronic kidney disease, stage III  Discharge Condition: Stable  Diet recommendation: Regular  Filed Weights   09/02/15 0427  Weight: 109.1 kg (240 lb 8.4 oz)    History of present illness:  On 09/02/2015 by Dr. Della Goo Lori Hansen is a 82 y.o. female resident of the [facility] who was sent to the ED due to increased somnolence and lethargy over the day. She had been last seen normal by her family at 2 PM. She is arousible and denies any pain. She was evaluated in the ED and a Ct scan of the head was performed and was negative for acute findings. Her labs were not remarkable, and a UDS was performed and was negative, and ABG has bee ordered. She was referred for Observation.  Hospital Course:  Acute encephalopathy -Appears to have resolved -Unclear etiology, question medications -CT head: No acute intracranial abnormalities -Patient appears be back at baseline, no neurological deficits -Chest x-ray shows pulmonary vascular congestion in this low inspiratory portable examination -Repeat CXR 09/02/15: Lunges clear, no evidence of acute cardiopulm abnormality -UA unremarkable for infection -Toxicology screen negative -MRI brain was ordered, however, cancelled after discussion with patient's daughter as the patient had no neurological deficits and had returned to her  baseline.   Essential hypertension -Continue home medications  Dementia  -Continue Aricept  Acute kidney injury vs Chronic kidney disease, stage III -HCTZ held, but may continue upon discharge.  Feel that patient likely has CKDstage III -repeat BMP in one week  Probable Compression fractures -noted on Chest xray -Follow-up compression fracture deformities within the lower thoracic spine of uncertain age, difficult to characterize due to osteopenia and overlying soft tissues. If any recent trauma or localizable midline back pain with consider CT -Currently patient denies any back pain, no reports of trauma.  Consultants None  Procedures  None  Discharge Exam: Filed Vitals:   09/02/15 2112 09/03/15 0641  BP: 123/59 111/53  Pulse: 69 72  Temp: 99 F (37.2 C) 99.2 F (37.3 C)  Resp: 18 18   Exam  General: Well developed, well nourished, No distress  HEENT: NCAT, mucous membranes moist.   Cardiovascular: S1 S2 auscultated, no murmurs  Respiratory: Clear to auscultation bilaterally  Abdomen: Soft, nontender, nondistended, + bowel sounds  Extremities: warm dry without cyanosis clubbing or edema  Neuro: AAOx2, has dementia, nonfocal  Skin: Without rashes exudates or nodules  Psych: Normal affect and demeanor, pleasant  Discharge Instructions      Discharge Instructions    Discharge instructions    Complete by:  As directed   Patient will be discharged to assisted living facility.  Patient will need to follow up with primary care provider within one week of discharge, repeat BMP in one week.  Patient should continue medications as prescribed.  Patient should follow a regular diet.            Medication List    TAKE these medications  acetaminophen 500 MG tablet  Commonly known as:  TYLENOL  Take 500 mg by mouth every 4 (four) hours as needed for moderate pain.     alum & mag hydroxide-simeth 200-200-25 MG chewable tablet  Commonly known as:   GELUSIL  Chew 1 tablet by mouth every 6 (six) hours as needed for indigestion or heartburn.     donepezil 10 MG tablet  Commonly known as:  ARICEPT  Take 10 mg by mouth at bedtime.     ergocalciferol 50000 units capsule  Commonly known as:  VITAMIN D2  Take 50,000 Units by mouth once a week.     esomeprazole 40 MG capsule  Commonly known as:  NEXIUM  Take 40 mg by mouth daily at 12 noon.     eucerin cream  Apply 1 application topically as needed for dry skin.     hydrochlorothiazide 12.5 MG capsule  Commonly known as:  MICROZIDE  Take 12.5 mg by mouth daily.     hydrOXYzine 25 MG tablet  Commonly known as:  ATARAX/VISTARIL  Take 25 mg by mouth 3 (three) times daily as needed.     loperamide 2 MG capsule  Commonly known as:  IMODIUM  Take 2 mg by mouth as needed for diarrhea or loose stools.     loratadine 10 MG tablet  Commonly known as:  CLARITIN  Take 10 mg by mouth daily.     magnesium hydroxide 400 MG/5ML suspension  Commonly known as:  MILK OF MAGNESIA  Take 30 mLs by mouth daily as needed for mild constipation.     metoprolol succinate 25 MG 24 hr tablet  Commonly known as:  TOPROL-XL  Take 25 mg by mouth daily.     neomycin-bacitracin-polymyxin Oint  Commonly known as:  NEOSPORIN  Apply 1 application topically daily as needed for wound care.     nystatin powder  Generic drug:  nystatin  Apply 1 g topically daily.     ROBAFEN 100 MG/5ML syrup  Generic drug:  guaifenesin  Take 200 mg by mouth 3 (three) times daily as needed for cough.     Travoprost (BAK Free) 0.004 % Soln ophthalmic solution  Commonly known as:  TRAVATAN  Place 1 drop into both eyes at bedtime.     triamcinolone cream 0.1 %  Commonly known as:  KENALOG  Apply 1 application topically 2 (two) times daily as needed (irritation).       No Known Allergies Follow-up Information    Follow up with Primary care physician. Schedule an appointment as soon as possible for a visit in 1 week.    Why:  Hospital follow up       The results of significant diagnostics from this hospitalization (including imaging, microbiology, ancillary and laboratory) are listed below for reference.    Significant Diagnostic Studies: Dg Chest 2 View  09/02/2015  CLINICAL DATA:  Lethargy. EXAM: CHEST  2 VIEW COMPARISON:  Chest x-ray dated 09/01/2015. FINDINGS: Heart size is normal. Overall cardiomediastinal silhouette is within normal limits in size and configuration. Lungs are clear. No evidence of pneumonia. No pleural effusion or pneumothorax seen. There appear to be compression fracture deformities of multiple vertebral bodies in the lower thoracic spine, difficult to characterize due to osteopenia and overlying soft tissues. Mild degenerative change noted within the mid thoracic spine. Osseous and soft tissue structures about the chest are otherwise unremarkable. IMPRESSION: 1. Lungs are clear and there is no evidence of acute cardiopulmonary abnormality. 2. Probable compression  fracture deformities within the lower thoracic spine, of uncertain age, difficult to characterize due to osteopenia and overlying soft tissues. If any recent trauma or localizable midline back pain, would consider CT for further characterization. These results will be called to the ordering clinician or representative by the Radiologist Assistant, and communication documented in the PACS or zVision Dashboard. Electronically Signed   By: Bary Richard M.D.   On: 09/02/2015 15:22   Ct Head Wo Contrast  09/02/2015  CLINICAL DATA:  Altered mental status. EXAM: CT HEAD WITHOUT CONTRAST TECHNIQUE: Contiguous axial images were obtained from the base of the skull through the vertex without intravenous contrast. COMPARISON:  None. FINDINGS: Diffuse cerebral atrophy. Mild ventricular dilatation consistent with central atrophy. Low-attenuation changes in the deep white matter consistent with small vessel ischemia. No mass effect or midline  shift. No abnormal extra-axial fluid collections. Gray-white matter junctions are distinct. Basal cisterns are not effaced. No evidence of acute intracranial hemorrhage. No depressed skull fractures. Mild mucosal thickening in the paranasal sinuses. Mastoid air cells are not opacified. Vascular calcifications. IMPRESSION: No acute intracranial abnormalities. Chronic atrophy and small vessel ischemic changes. Electronically Signed   By: Burman Nieves M.D.   On: 09/02/2015 02:03   Dg Chest Portable 1 View  09/02/2015  CLINICAL DATA:  Altered mental status. EXAM: PORTABLE CHEST 1 VIEW COMPARISON:  None. FINDINGS: Cardiomediastinal silhouette is unremarkable for this low inspiratory portable examination with crowded engorged vasculature markings. The lungs are clear without pleural effusions or focal consolidations. Elevated RIGHT hemidiaphragm. Trachea projects midline and there is no pneumothorax. Included soft tissue planes and osseous structures are non-suspicious. IMPRESSION: Pulmonary vascular congestion in this low inspiratory portable examination. Electronically Signed   By: Awilda Metro M.D.   On: 09/02/2015 00:42    Microbiology: Recent Results (from the past 240 hour(s))  MRSA PCR Screening     Status: None   Collection Time: 09/02/15  5:02 AM  Result Value Ref Range Status   MRSA by PCR NEGATIVE NEGATIVE Final    Comment:        The GeneXpert MRSA Assay (FDA approved for NASAL specimens only), is one component of a comprehensive MRSA colonization surveillance program. It is not intended to diagnose MRSA infection nor to guide or monitor treatment for MRSA infections.      Labs: Basic Metabolic Panel:  Recent Labs Lab 09/02/15 0025 09/02/15 0618 09/03/15 0526  NA 142 142 142  K 3.5 3.9 3.7  CL 106 106 110  CO2 25 21* 24  GLUCOSE 101* 77 91  BUN 13 12 12   CREATININE 1.36* 1.21* 1.31*  CALCIUM 9.0 9.6 9.3   Liver Function Tests:  Recent Labs Lab  09/02/15 0025  AST 40  ALT 23  ALKPHOS 85  BILITOT 0.9  PROT 6.3*  ALBUMIN 3.2*   No results for input(s): LIPASE, AMYLASE in the last 168 hours.  Recent Labs Lab 09/02/15 0023  AMMONIA 30   CBC:  Recent Labs Lab 09/02/15 0025 09/02/15 0618 09/03/15 0701  WBC 11.0* 13.0* 12.1*  NEUTROABS 3.5  --   --   HGB 12.4 13.0 12.6  HCT 38.7 40.3 39.5  MCV 90.4 89.8 88.8  PLT 241 239 242   Cardiac Enzymes: No results for input(s): CKTOTAL, CKMB, CKMBINDEX, TROPONINI in the last 168 hours. BNP: BNP (last 3 results) No results for input(s): BNP in the last 8760 hours.  ProBNP (last 3 results) No results for input(s): PROBNP in the last 8760 hours.  CBG:  Recent Labs Lab 09/02/15 0741 09/02/15 0824 09/02/15 1255 09/02/15 1717 09/03/15 0809  GLUCAP 68 82 144* 74 82       Signed:  Lagretta Loseke  [facility] 09/03/2015, 11:06 AM

## 2016-04-14 DEATH — deceased

## 2016-12-26 IMAGING — CR DG CHEST 1V PORT
1 series · 1 of 1 positions shown · non-contrast
Comparison: None.

CLINICAL DATA: Altered mental status.

EXAM:
PORTABLE CHEST 1 VIEW

[AP]
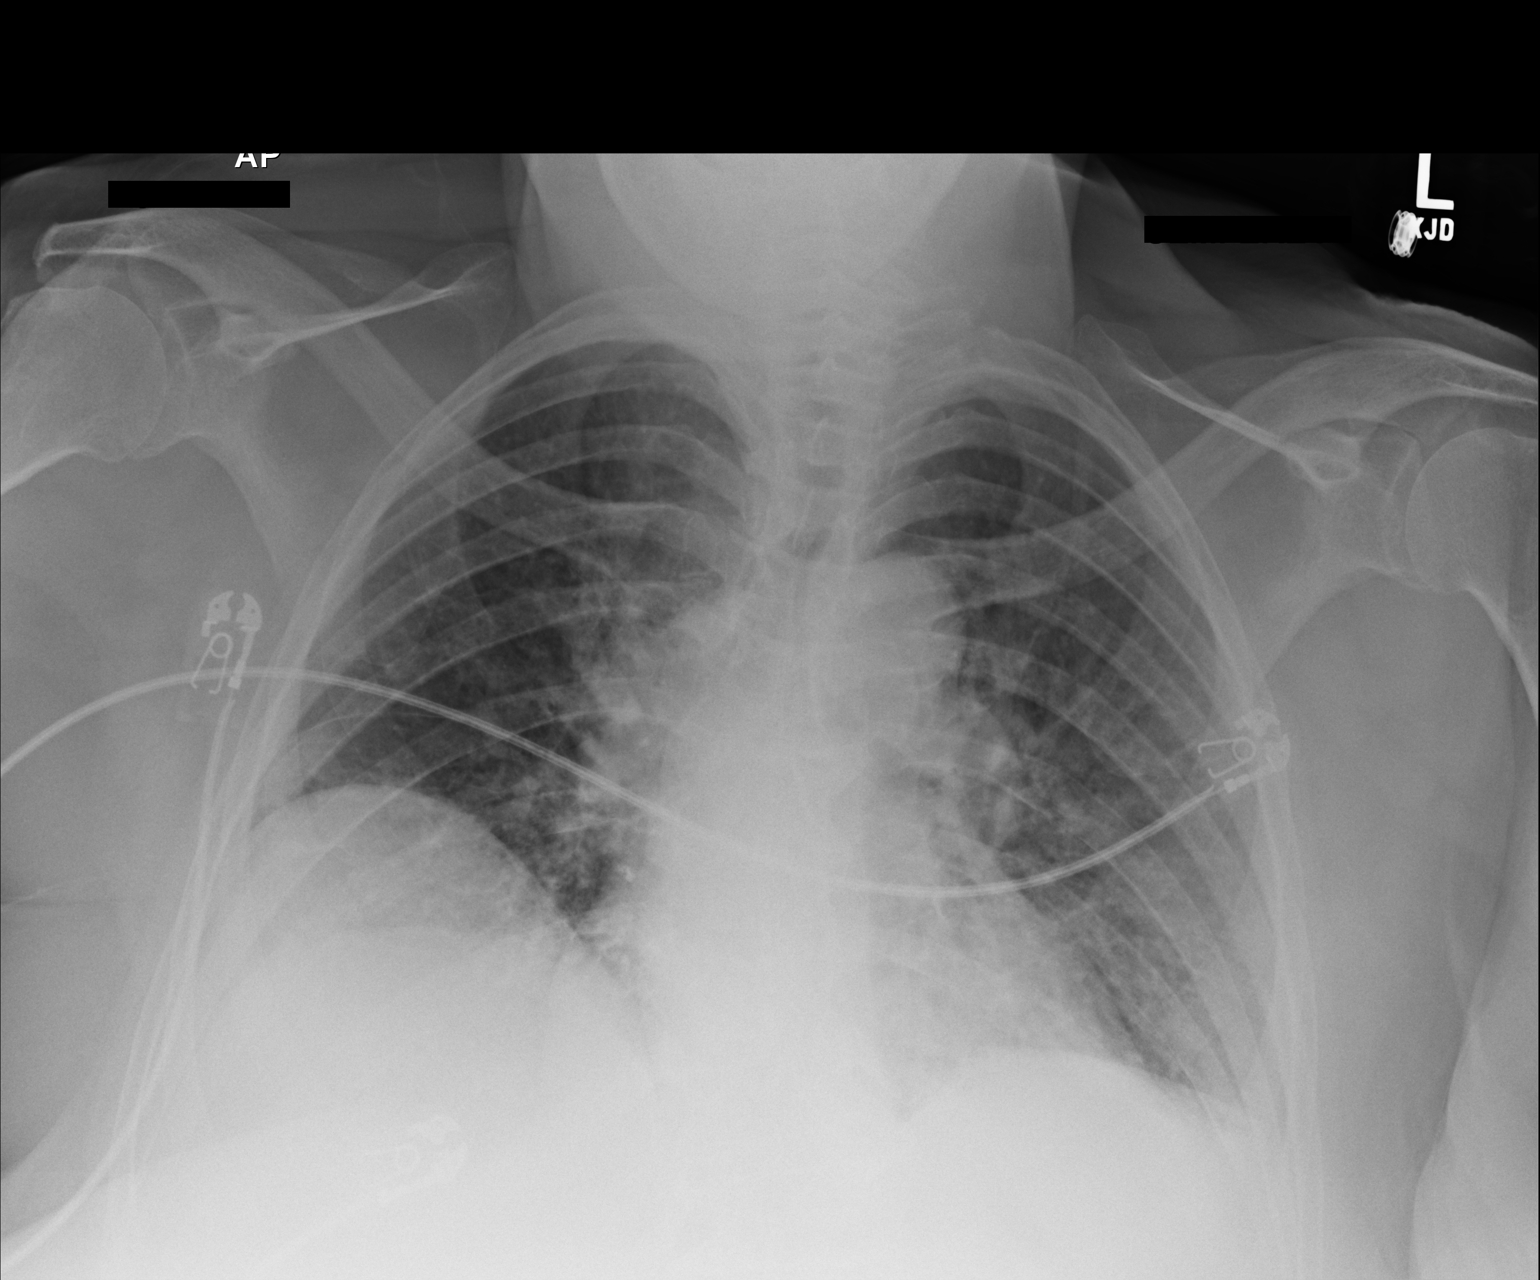

[1 of 1 positions shown; findings below may reference images not displayed]

FINDINGS: Cardiomediastinal silhouette is unremarkable for this low
inspiratory portable examination with crowded engorged vasculature
markings. The lungs are clear without pleural effusions or focal
consolidations. Elevated RIGHT hemidiaphragm. Trachea projects
midline and there is no pneumothorax. Included soft tissue planes
and osseous structures are non-suspicious.
IMPRESSION: Pulmonary vascular congestion in this low inspiratory portable
examination.

## 2016-12-27 IMAGING — DX DG CHEST 2V
2 series · 2 of 2 positions shown · non-contrast
Comparison: Chest x-ray dated 09/01/2015.

CLINICAL DATA: Lethargy.

EXAM:
CHEST  2 VIEW

[chest pa]
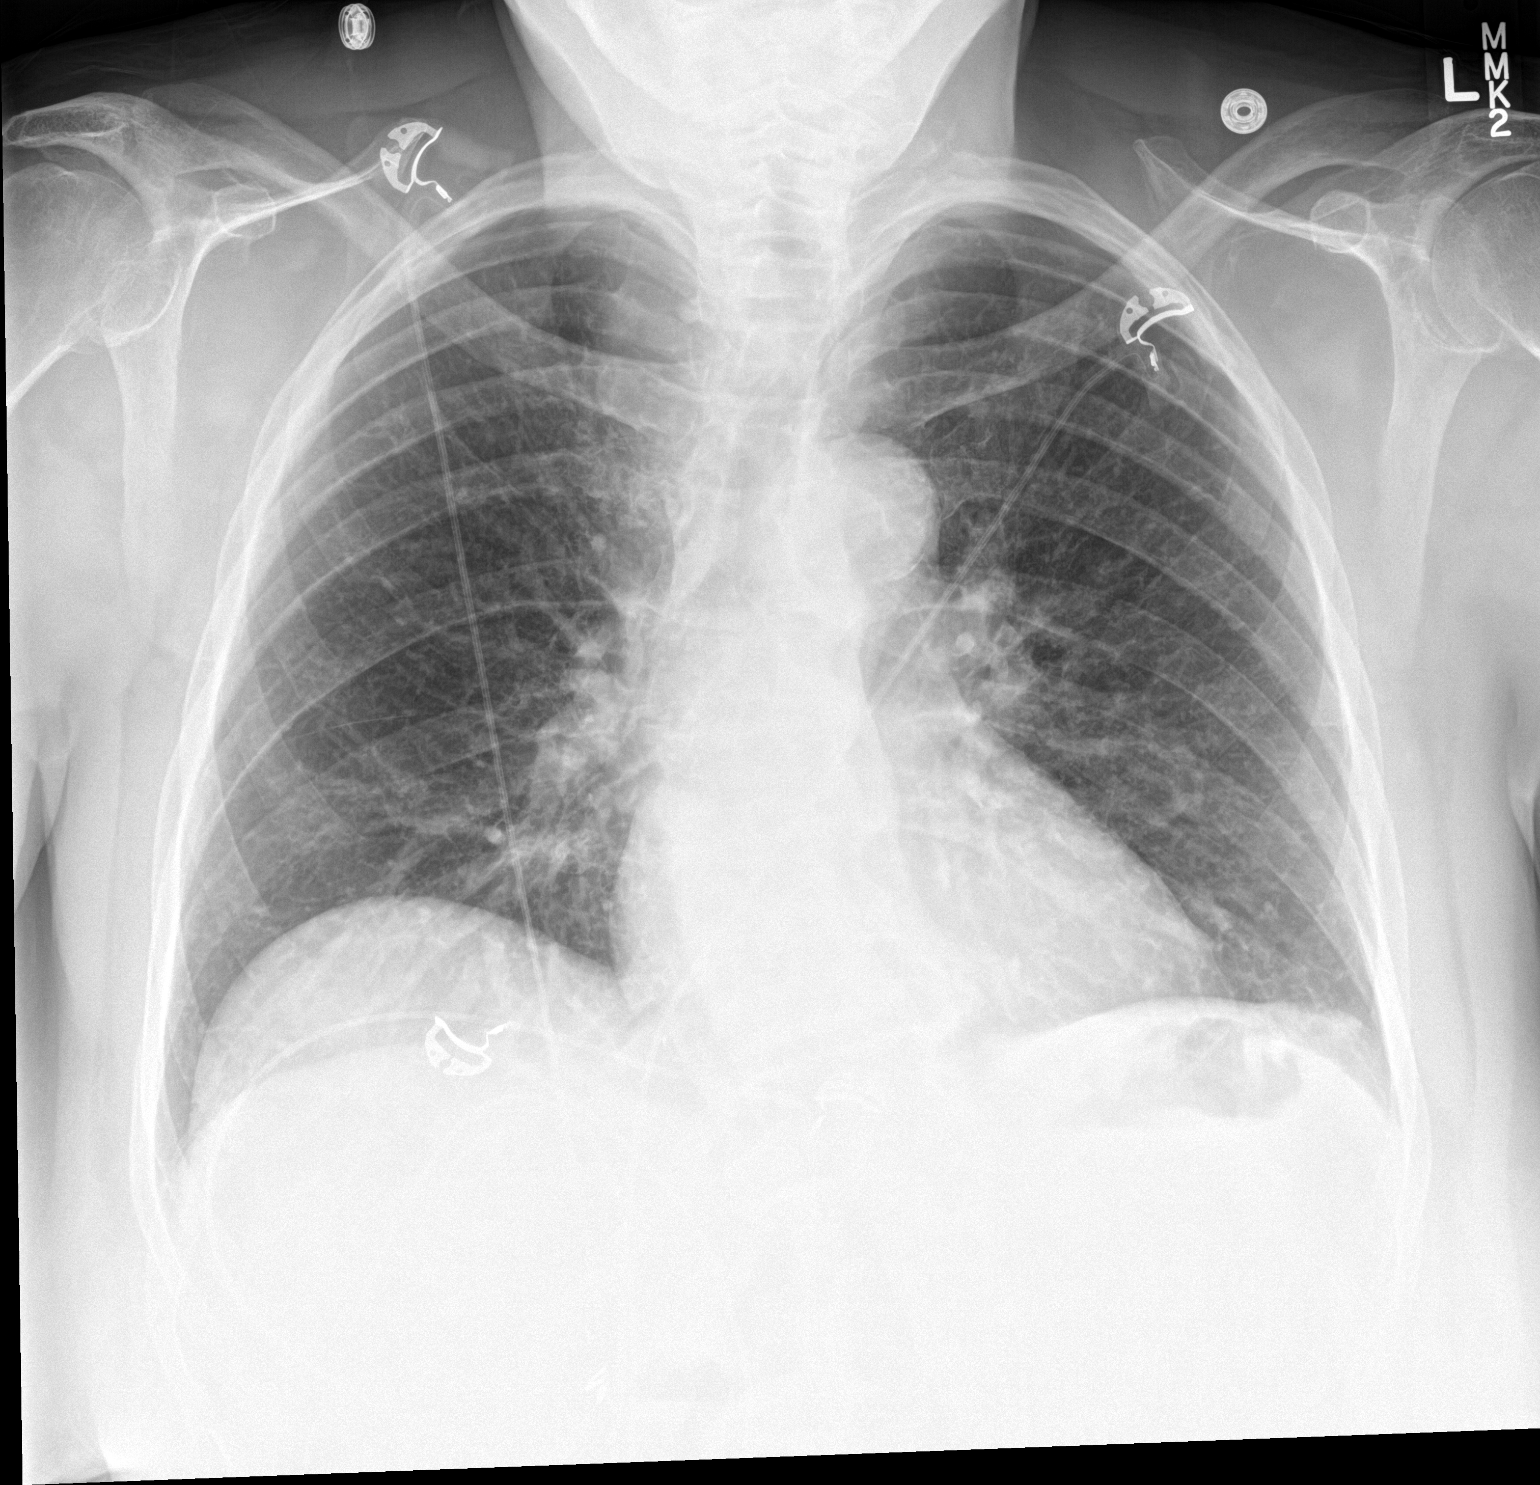

[chest lat]
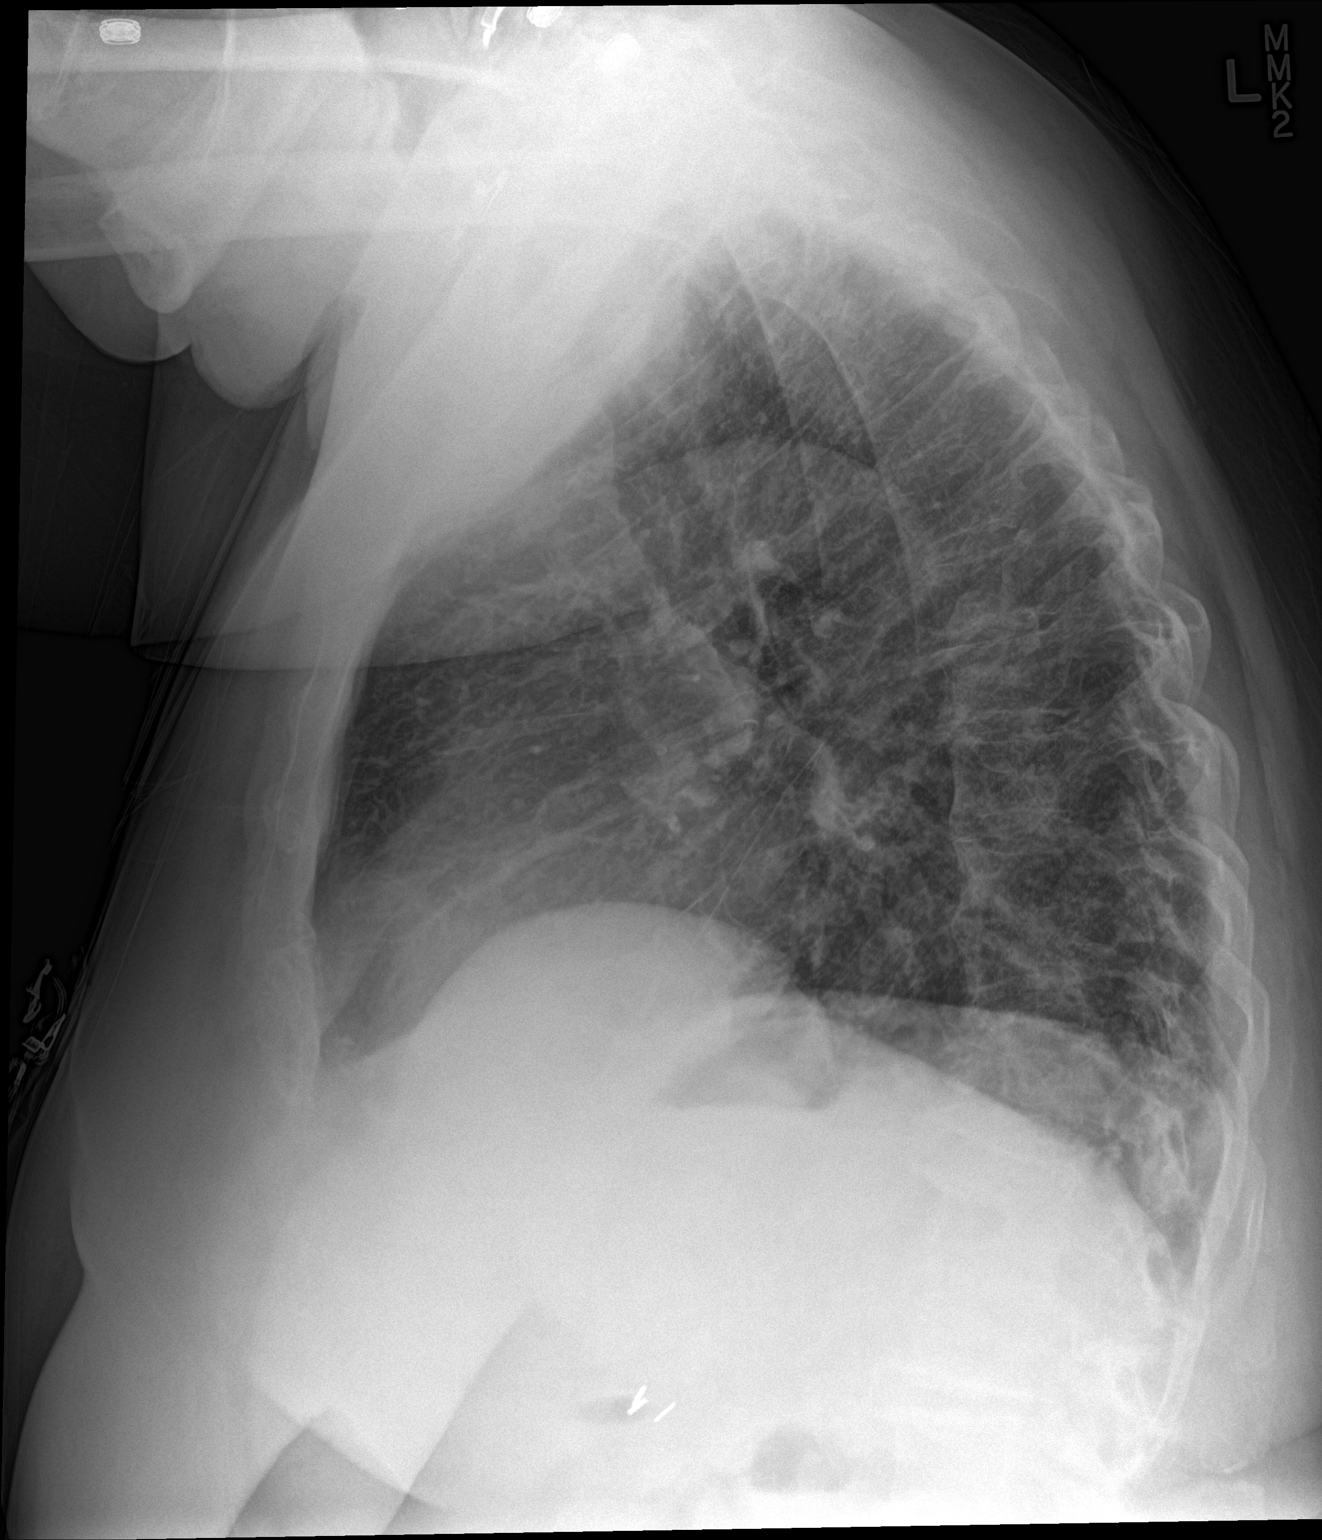

[2 of 2 positions shown; findings below may reference images not displayed]

FINDINGS: Heart size is normal. Overall cardiomediastinal silhouette is within
normal limits in size and configuration. Lungs are clear. No
evidence of pneumonia. No pleural effusion or pneumothorax seen.

There appear to be compression fracture deformities of multiple
vertebral bodies in the lower thoracic spine, difficult to
characterize due to osteopenia and overlying soft tissues. Mild
degenerative change noted within the mid thoracic spine. Osseous and
soft tissue structures about the chest are otherwise unremarkable.
IMPRESSION: 1. Lungs are clear and there is no evidence of acute cardiopulmonary
abnormality.
2. Probable compression fracture deformities within the lower
thoracic spine, of uncertain age, difficult to characterize due to
osteopenia and overlying soft tissues. If any recent trauma or
localizable midline back pain, would consider CT for further
characterization.
These results will be called to the ordering clinician or
representative by the Radiologist Assistant, and communication
documented in the PACS or zVision Dashboard.

## 2016-12-27 IMAGING — CT CT HEAD W/O CM
3 of 4 series · 18 of 47 positions shown, 21 images · non-contrast
Comparison: None.

CLINICAL DATA: Altered mental status.

EXAM:
CT HEAD WITHOUT CONTRAST
TECHNIQUE: Contiguous axial images were obtained from the base of the skull
through the vertex without intravenous contrast.

[Series 201: head w/o, idose (1) · axial · non-contrast · 0.41mm/px · z∈[+76,+206]mm · 12 of 32 slices shown, 15 images]
[im 3/32  brain]
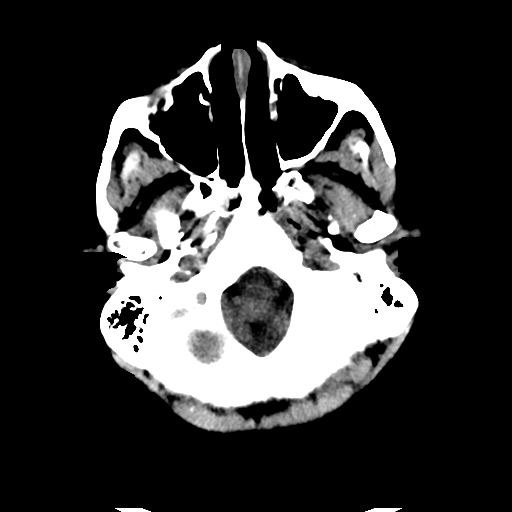
[im 3/32  bone]
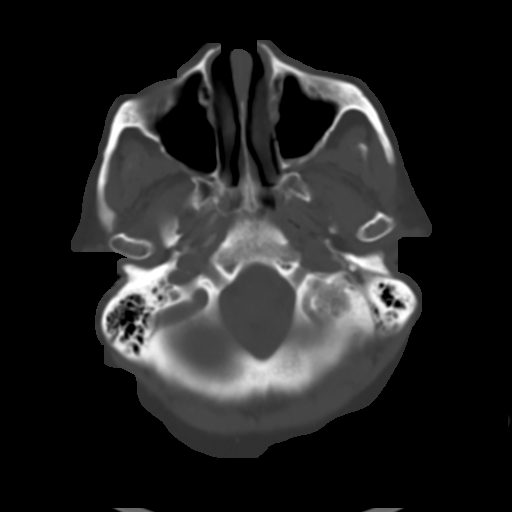
[im 5/32  brain]
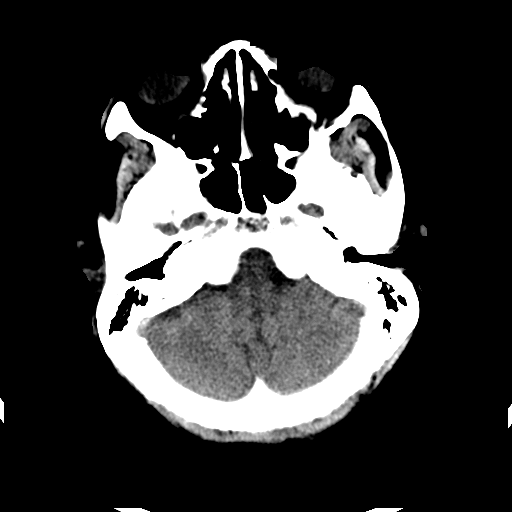
[im 7/32  brain]
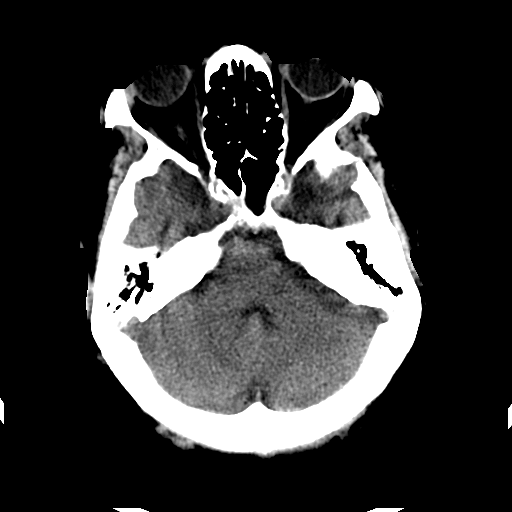
[im 9/32  brain]
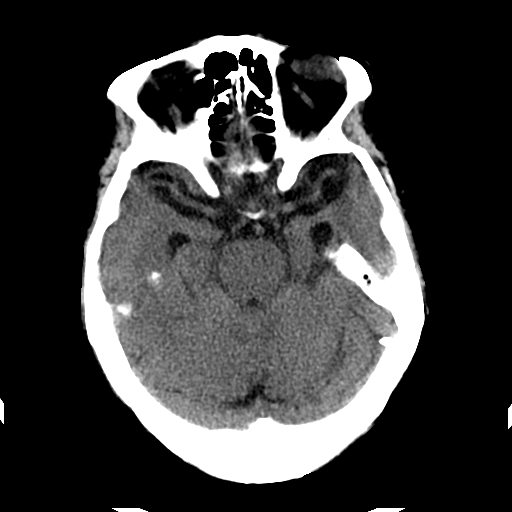
[im 12/32  brain]
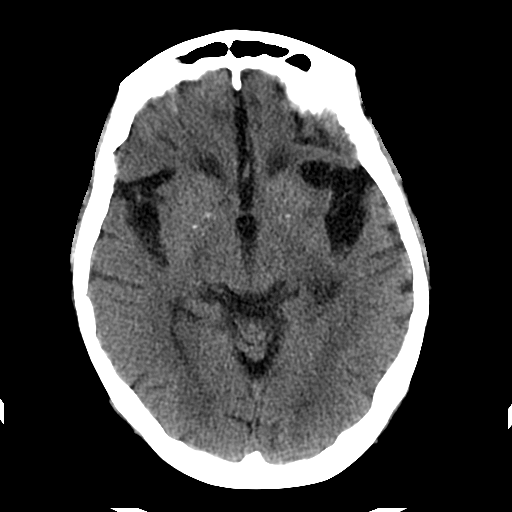
[im 12/32  bone]
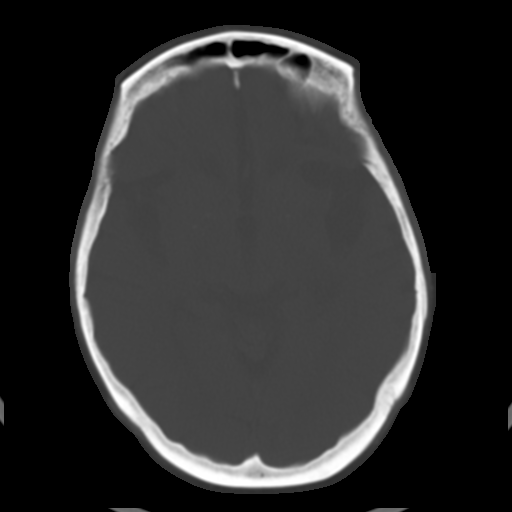
[im 14/32  brain]
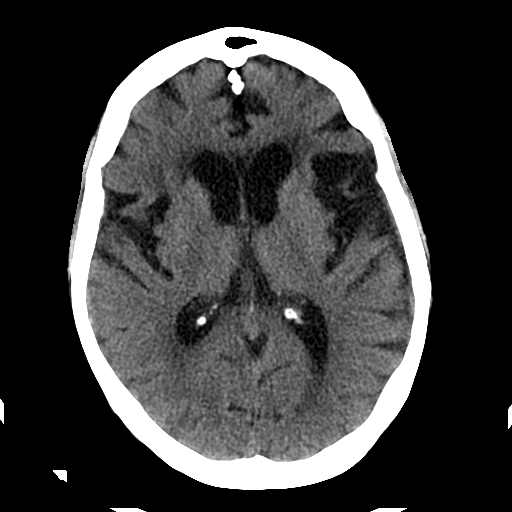
[im 18/32  brain]
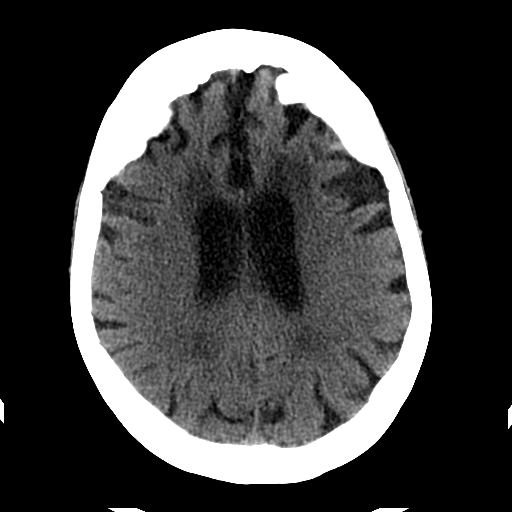
[im 20/32  brain]
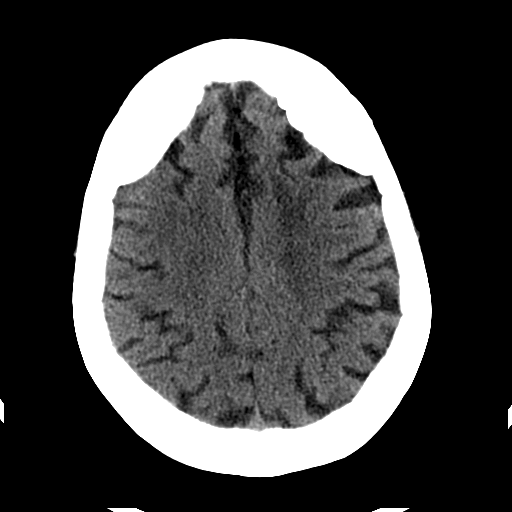
[im 23/32  brain]
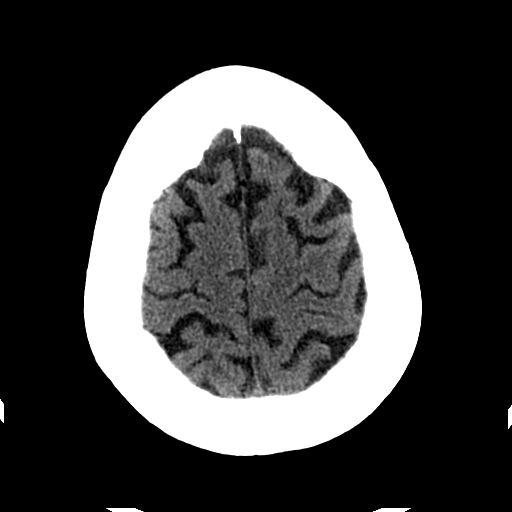
[im 23/32  bone]
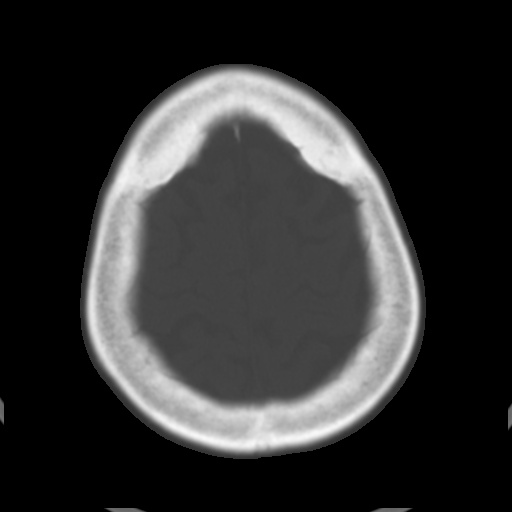
[im 25/32  brain]
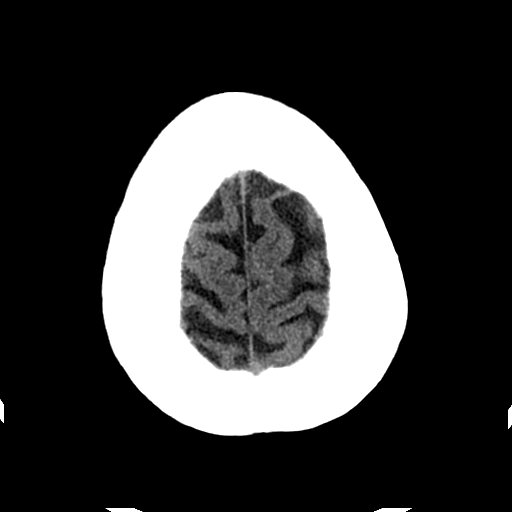
[im 27/32  brain]
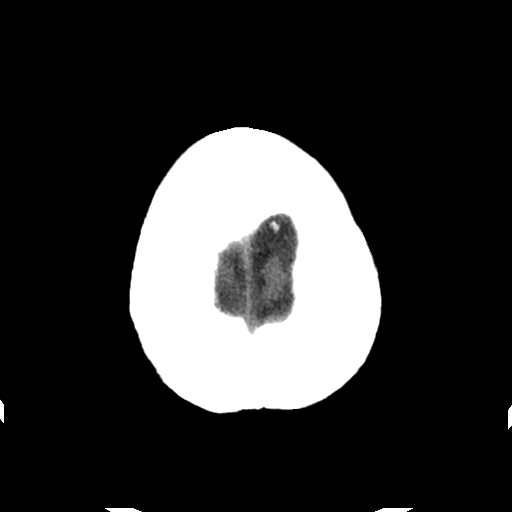
[im 29/32  brain]
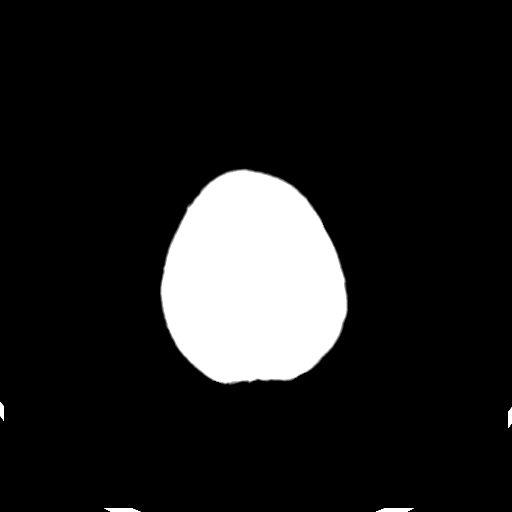

[Series 203: coronal st, idose (1) · coronal · 0.40mm/px · 3 of 69 slices shown]
[im 23/69  brain]
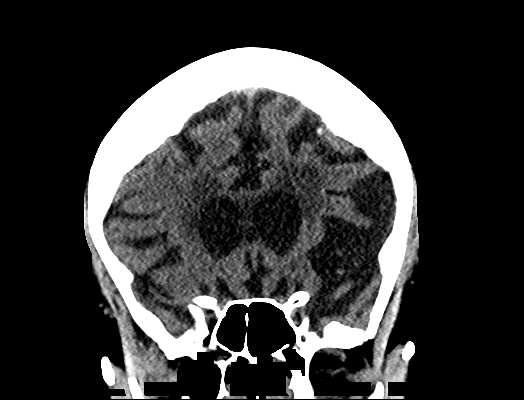
[im 31/69  brain]
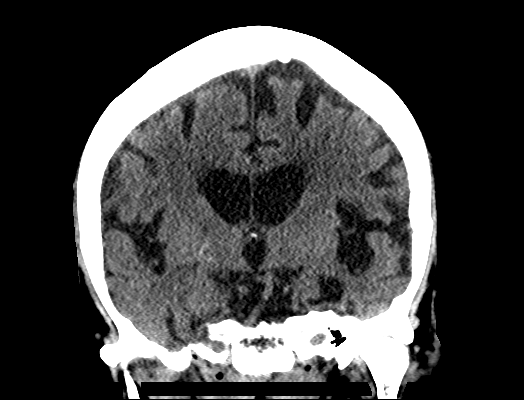
[im 38/69  brain]
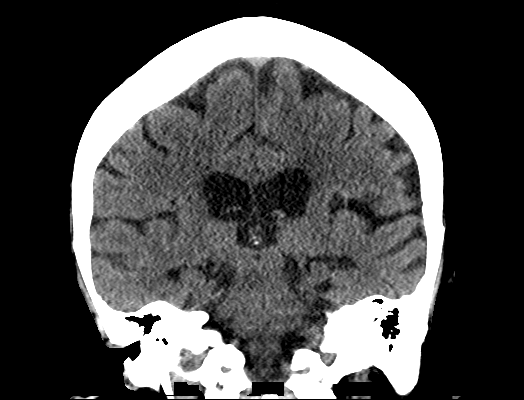

[Series 204: sagittal st, idose (1) · sagittal · 0.40mm/px · 3 of 65 slices shown]
[im 22/65  brain]
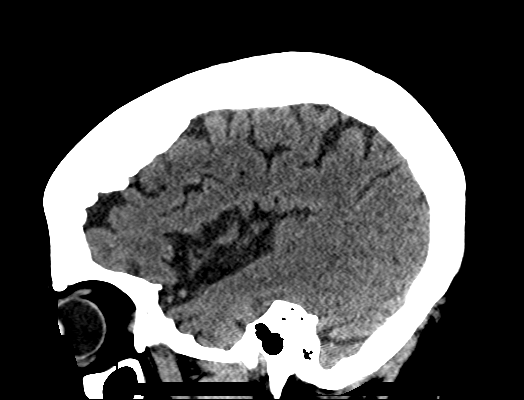
[im 33/65  brain]
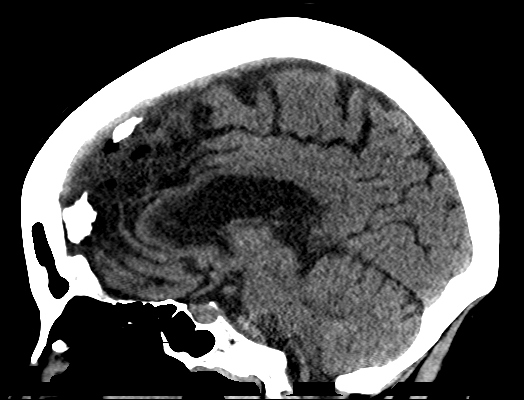
[im 43/65  brain]
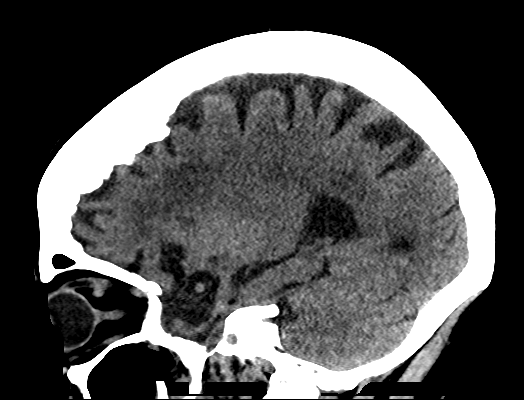

[18 of 47 positions shown; findings below may reference images not displayed]

FINDINGS: Diffuse cerebral atrophy. Mild ventricular dilatation consistent
with central atrophy. Low-attenuation changes in the deep white
matter consistent with small vessel ischemia. No mass effect or
midline shift. No abnormal extra-axial fluid collections. Gray-white
matter junctions are distinct. Basal cisterns are not effaced. No
evidence of acute intracranial hemorrhage. No depressed skull
fractures. Mild mucosal thickening in the paranasal sinuses. Mastoid
air cells are not opacified. Vascular calcifications.
IMPRESSION: No acute intracranial abnormalities. Chronic atrophy and small
vessel ischemic changes.
